# Patient Record
Sex: Female | Born: 1965 | Race: White | Hispanic: No | Marital: Married | Smoking: Current every day smoker
Health system: Southern US, Community
[De-identification: ages and names within clinical notes are randomized; demographics above are authoritative.]

## PROBLEM LIST (undated history)

## (undated) HISTORY — PX: ANAL FISSURE REPAIR: SHX2312

---

## 2014-05-28 ENCOUNTER — Ambulatory Visit: Payer: Self-pay | Admitting: Internal Medicine

## 2014-05-28 IMAGING — US THYROID ULTRASOUND
1 series · 14 of 25 positions shown · non-contrast
Comparison: None.

CLINICAL DATA: Thyromegaly, dysphagia x7-8 months.

EXAM:
THYROID ULTRASOUND
TECHNIQUE: Ultrasound examination of the thyroid gland and adjacent soft
tissues was performed.

[Series 1: thyroid ultrasound · 0.09mm/px · 14 of 41 slices shown]
[im 1/41]
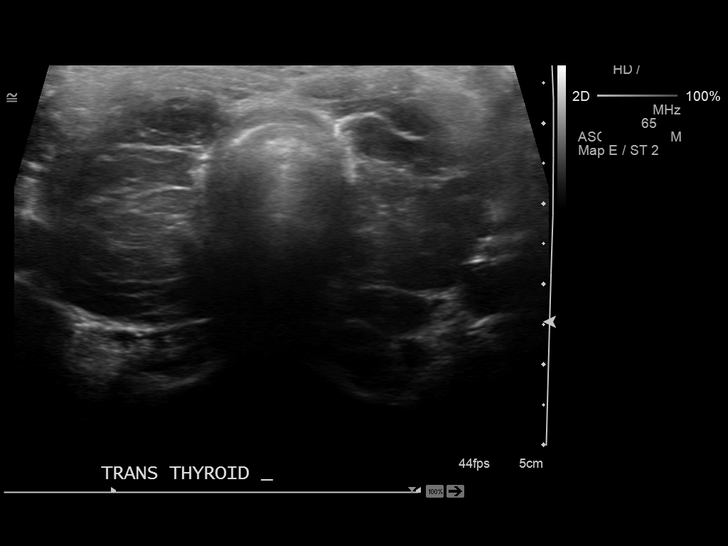
[im 4/41]
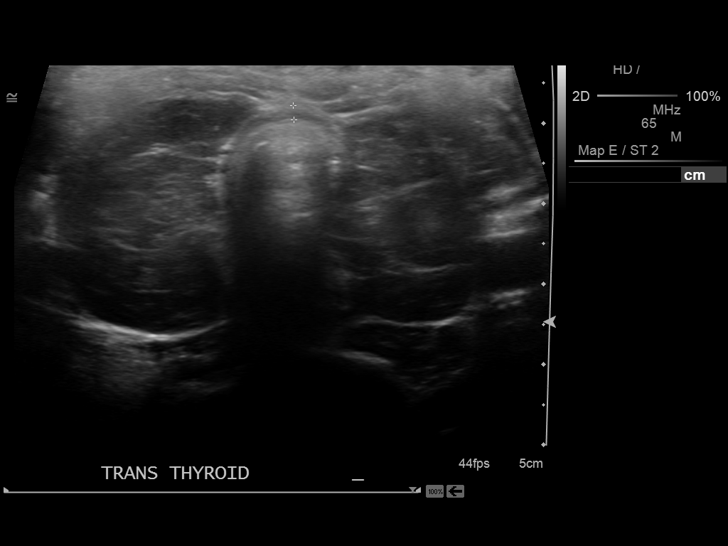
[im 7/41]
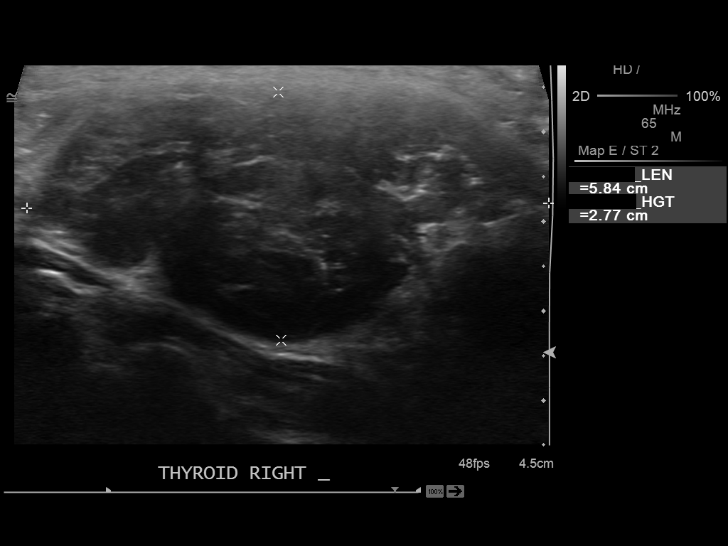
[im 11/41]
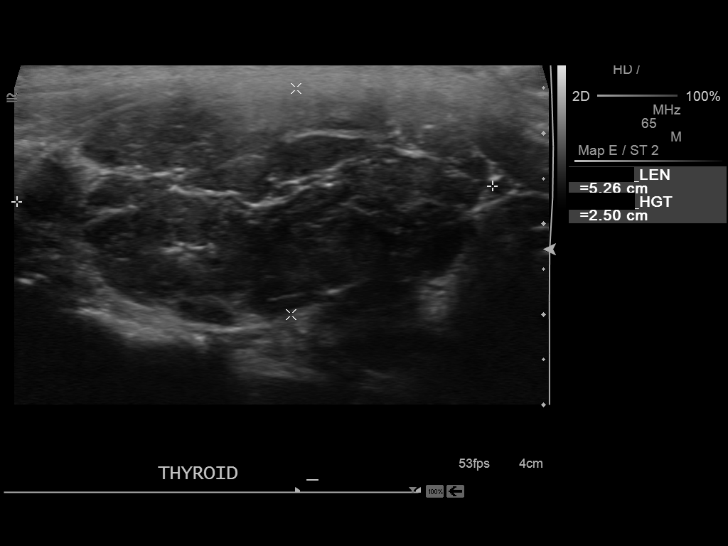
[im 14/41]
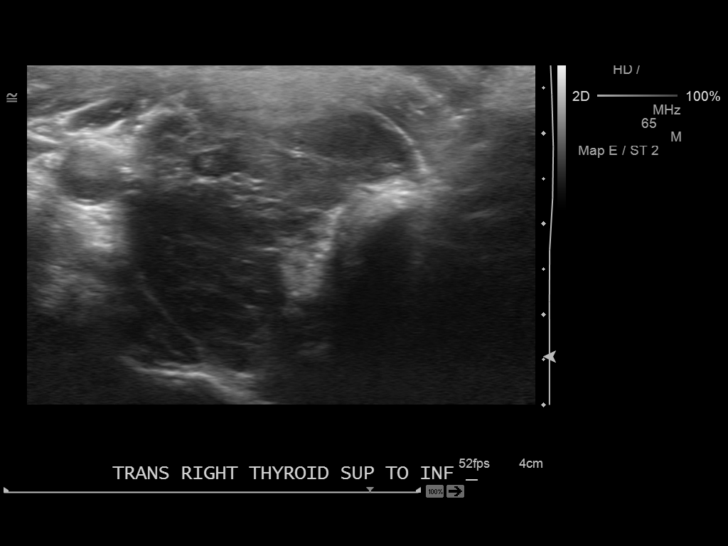
[im 16/41]
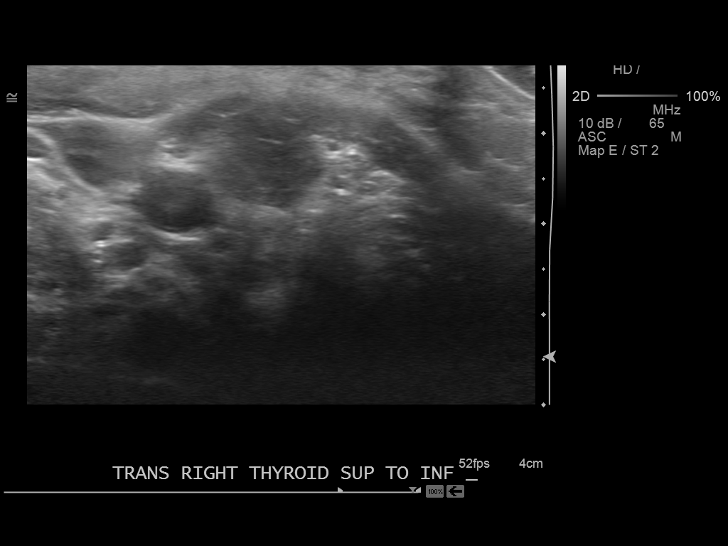
[im 19/41]
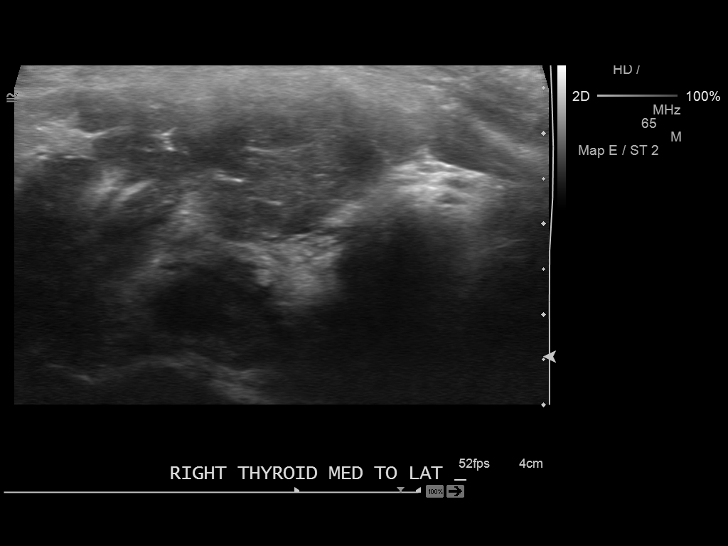
[im 22/41]
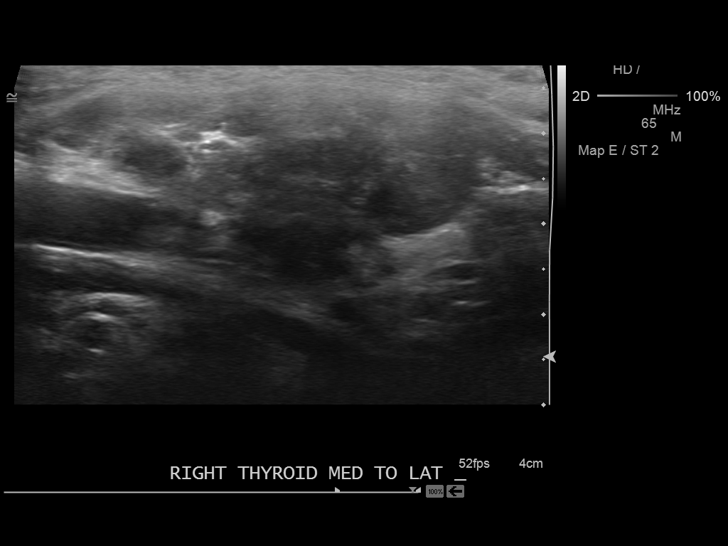
[im 26/41]
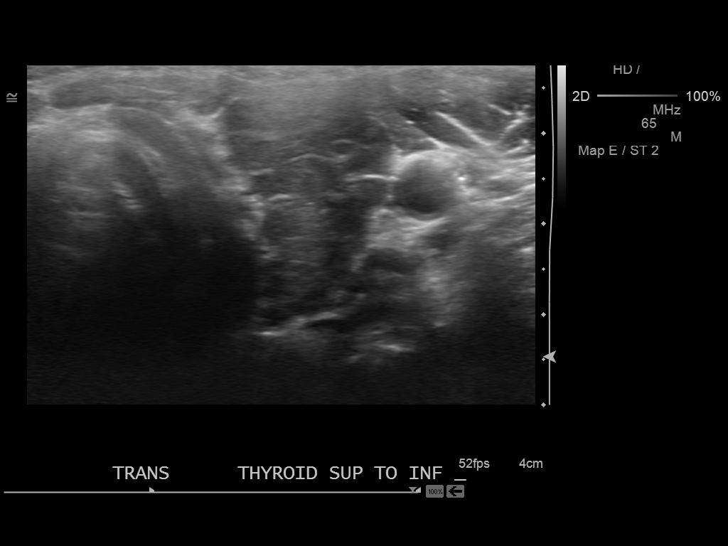
[im 27/41]
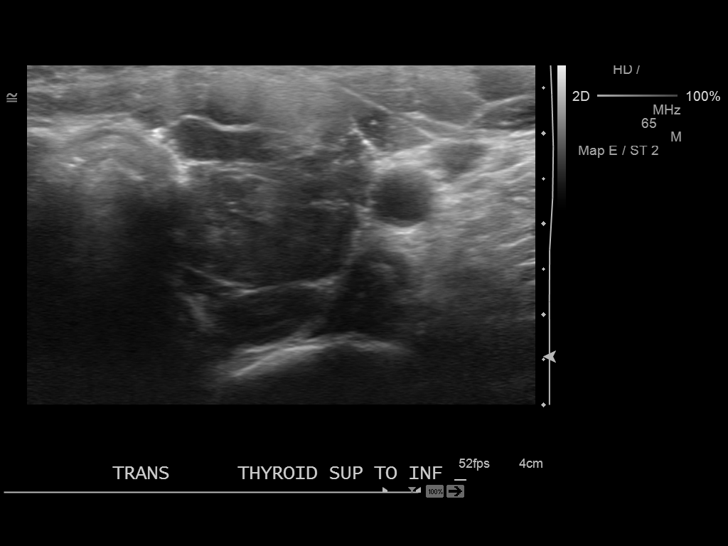
[im 31/41]
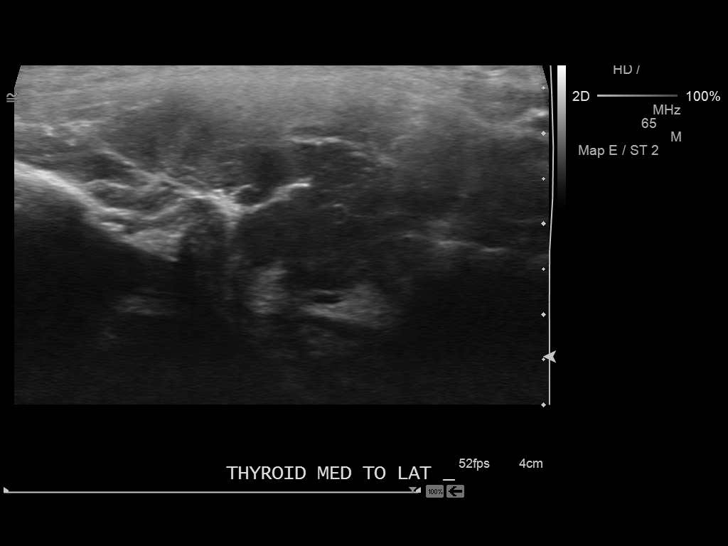
[im 34/41]
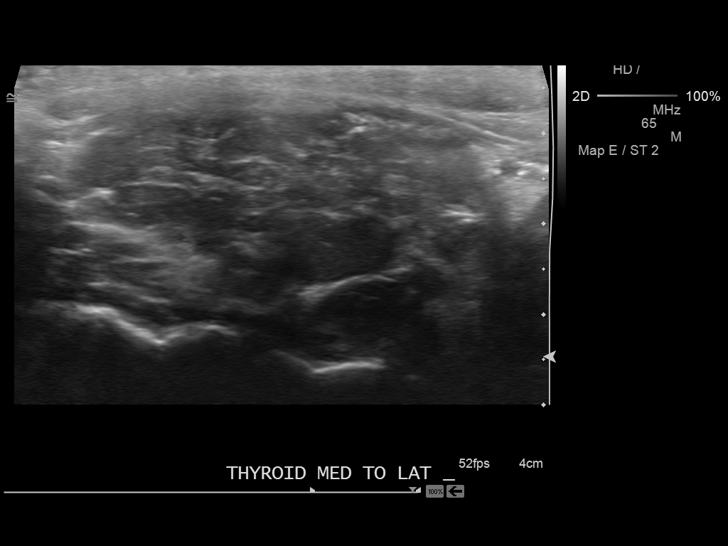
[im 37/41]
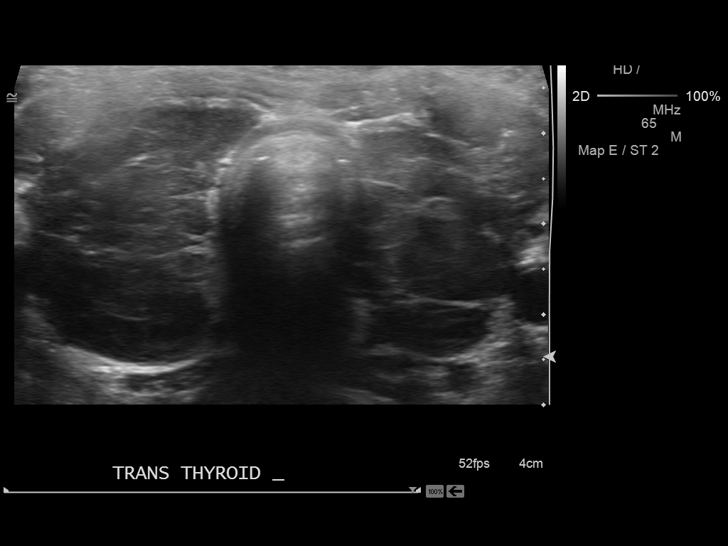
[im 41/41]
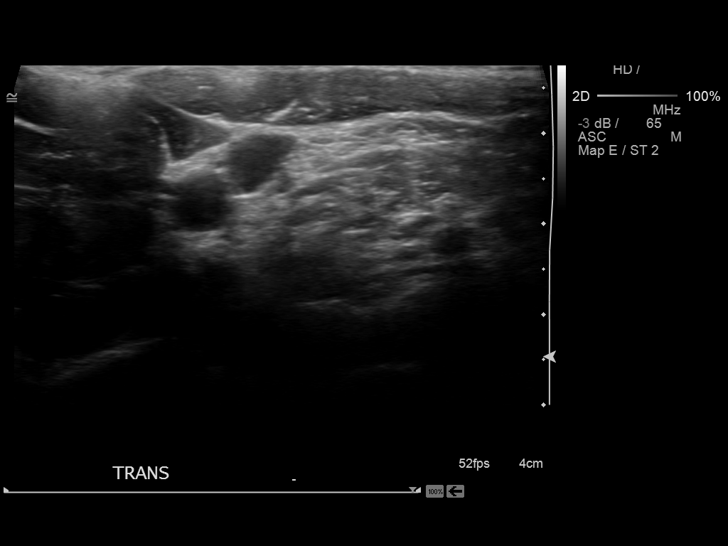

[14 of 25 positions shown; findings below may reference images not displayed]

FINDINGS: Right thyroid lobe

Measurements: 58 x 28 x 23 mm. Markedly inhomogeneous echotexture
without discrete nodule or mass.

Left thyroid lobe

Measurements: 53 x 25 x 21 mm.  No nodules visualized.

Isthmus

Thickness: 2 mm.  No nodules visualized.

Lymphadenopathy

None visualized.
IMPRESSION: 1. Moderate thyromegaly without focal lesion.

## 2015-01-22 ENCOUNTER — Encounter: Payer: Self-pay | Admitting: Internal Medicine

## 2015-01-22 ENCOUNTER — Other Ambulatory Visit: Payer: Self-pay | Admitting: Internal Medicine

## 2015-01-27 ENCOUNTER — Encounter: Payer: Self-pay | Admitting: Internal Medicine

## 2015-01-27 ENCOUNTER — Other Ambulatory Visit: Payer: Self-pay | Admitting: Internal Medicine

## 2015-01-27 DIAGNOSIS — E010 Iodine-deficiency related diffuse (endemic) goiter: Secondary | ICD-10-CM

## 2015-01-27 DIAGNOSIS — F17200 Nicotine dependence, unspecified, uncomplicated: Secondary | ICD-10-CM | POA: Insufficient documentation

## 2015-01-27 DIAGNOSIS — F32A Depression, unspecified: Secondary | ICD-10-CM | POA: Insufficient documentation

## 2015-01-27 DIAGNOSIS — D251 Intramural leiomyoma of uterus: Secondary | ICD-10-CM | POA: Insufficient documentation

## 2015-01-27 DIAGNOSIS — E785 Hyperlipidemia, unspecified: Secondary | ICD-10-CM | POA: Insufficient documentation

## 2015-01-27 DIAGNOSIS — E7402 Pompe disease: Secondary | ICD-10-CM | POA: Insufficient documentation

## 2015-01-27 DIAGNOSIS — F329 Major depressive disorder, single episode, unspecified: Secondary | ICD-10-CM | POA: Insufficient documentation

## 2015-06-23 ENCOUNTER — Other Ambulatory Visit: Payer: Self-pay | Admitting: Internal Medicine

## 2015-06-25 NOTE — Telephone Encounter (Signed)
No longer a pt switched pcp
